# Patient Record
Sex: Male | Born: 1945 | Race: White | Hispanic: No | Marital: Married | Smoking: Never smoker
Health system: Southern US, Community
[De-identification: ages and names within clinical notes are randomized; demographics above are authoritative.]

## PROBLEM LIST (undated history)

## (undated) DIAGNOSIS — E782 Mixed hyperlipidemia: Secondary | ICD-10-CM

## (undated) DIAGNOSIS — G4730 Sleep apnea, unspecified: Secondary | ICD-10-CM

## (undated) DIAGNOSIS — I509 Heart failure, unspecified: Secondary | ICD-10-CM

## (undated) DIAGNOSIS — I82409 Acute embolism and thrombosis of unspecified deep veins of unspecified lower extremity: Secondary | ICD-10-CM

## (undated) DIAGNOSIS — IMO0002 Reserved for concepts with insufficient information to code with codable children: Secondary | ICD-10-CM

## (undated) DIAGNOSIS — I10 Essential (primary) hypertension: Secondary | ICD-10-CM

## (undated) DIAGNOSIS — I2510 Atherosclerotic heart disease of native coronary artery without angina pectoris: Secondary | ICD-10-CM

## (undated) DIAGNOSIS — E1165 Type 2 diabetes mellitus with hyperglycemia: Secondary | ICD-10-CM

## (undated) DIAGNOSIS — R911 Solitary pulmonary nodule: Secondary | ICD-10-CM

## (undated) DIAGNOSIS — E559 Vitamin D deficiency, unspecified: Secondary | ICD-10-CM

## (undated) DIAGNOSIS — E7800 Pure hypercholesterolemia, unspecified: Secondary | ICD-10-CM

## (undated) DIAGNOSIS — E039 Hypothyroidism, unspecified: Secondary | ICD-10-CM

## (undated) HISTORY — DX: Reserved for concepts with insufficient information to code with codable children: IMO0002

## (undated) HISTORY — DX: Solitary pulmonary nodule: R91.1

## (undated) HISTORY — DX: Heart failure, unspecified: I50.9

## (undated) HISTORY — DX: Pure hypercholesterolemia, unspecified: E78.00

## (undated) HISTORY — DX: Vitamin D deficiency, unspecified: E55.9

## (undated) HISTORY — DX: Acute embolism and thrombosis of unspecified deep veins of unspecified lower extremity: I82.409

## (undated) HISTORY — DX: Essential (primary) hypertension: I10

## (undated) HISTORY — DX: Sleep apnea, unspecified: G47.30

## (undated) HISTORY — DX: Atherosclerotic heart disease of native coronary artery without angina pectoris: I25.10

## (undated) HISTORY — DX: Type 2 diabetes mellitus with hyperglycemia: E11.65

## (undated) HISTORY — DX: Hypothyroidism, unspecified: E03.9

## (undated) HISTORY — DX: Mixed hyperlipidemia: E78.2

---

## 1998-02-09 ENCOUNTER — Encounter: Admission: RE | Admit: 1998-02-09 | Discharge: 1998-02-09 | Payer: Self-pay | Admitting: *Deleted

## 1999-03-30 ENCOUNTER — Encounter: Admission: RE | Admit: 1999-03-30 | Discharge: 1999-06-28 | Payer: Self-pay | Admitting: Family Medicine

## 2002-05-20 ENCOUNTER — Encounter: Payer: Self-pay | Admitting: Orthopedic Surgery

## 2002-05-26 ENCOUNTER — Inpatient Hospital Stay (HOSPITAL_COMMUNITY): Admission: RE | Admit: 2002-05-26 | Discharge: 2002-05-30 | Payer: Self-pay | Admitting: Orthopedic Surgery

## 2002-06-23 ENCOUNTER — Encounter: Admission: RE | Admit: 2002-06-23 | Discharge: 2002-08-21 | Payer: Self-pay | Admitting: Orthopedic Surgery

## 2005-08-16 ENCOUNTER — Inpatient Hospital Stay (HOSPITAL_COMMUNITY): Admission: RE | Admit: 2005-08-16 | Discharge: 2005-08-21 | Payer: Self-pay | Admitting: Orthopedic Surgery

## 2006-02-22 ENCOUNTER — Emergency Department (HOSPITAL_COMMUNITY): Admission: EM | Admit: 2006-02-22 | Discharge: 2006-02-23 | Payer: Self-pay | Admitting: Emergency Medicine

## 2007-05-08 ENCOUNTER — Encounter: Admission: RE | Admit: 2007-05-08 | Discharge: 2007-05-08 | Payer: Self-pay | Admitting: Emergency Medicine

## 2007-10-14 ENCOUNTER — Inpatient Hospital Stay (HOSPITAL_COMMUNITY): Admission: RE | Admit: 2007-10-14 | Discharge: 2007-10-17 | Payer: Self-pay | Admitting: Orthopedic Surgery

## 2008-12-28 IMAGING — CR DG HIP COMPLETE 2+V*R*
3 series · 3 of 3 positions shown · non-contrast
Comparison: No prior studies.

CLINICAL DATA: Osteoarthritis.  Preoperative study.

RIGHT HIP - COMPLETE 2+ VIEW

[t pelvis a.p.]
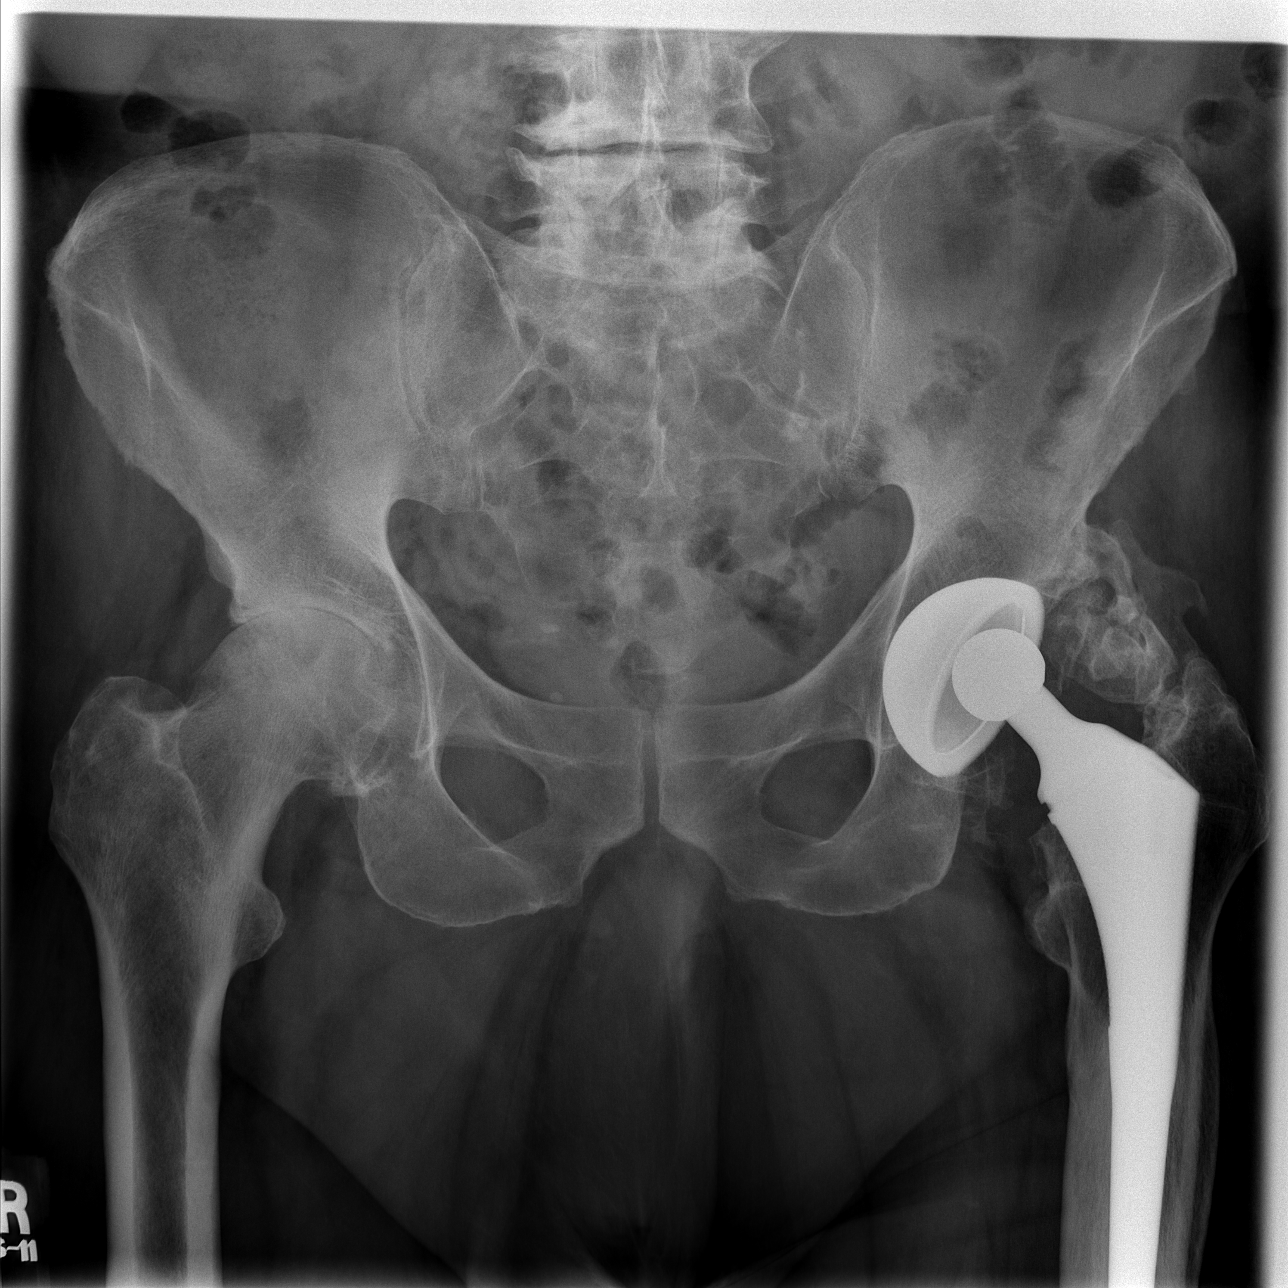

[t hip ap right]
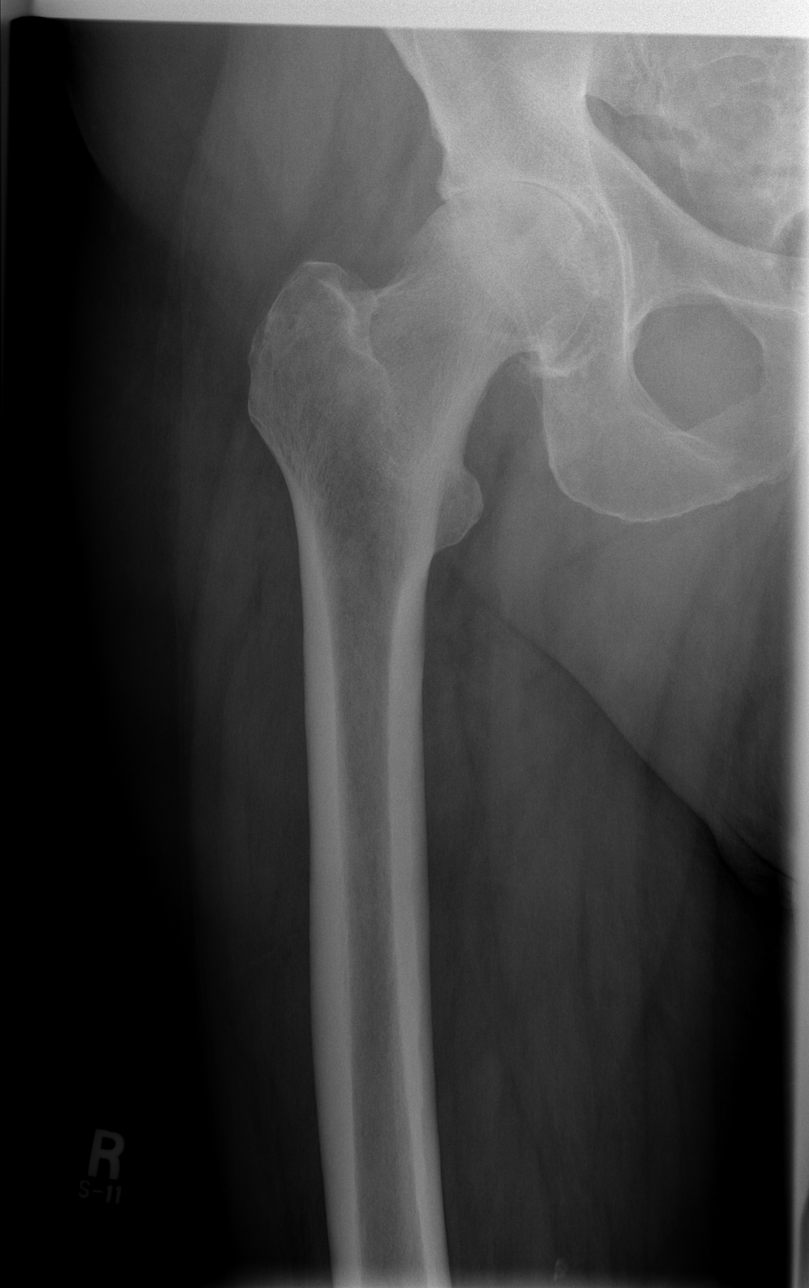

[t hip frog leg right]
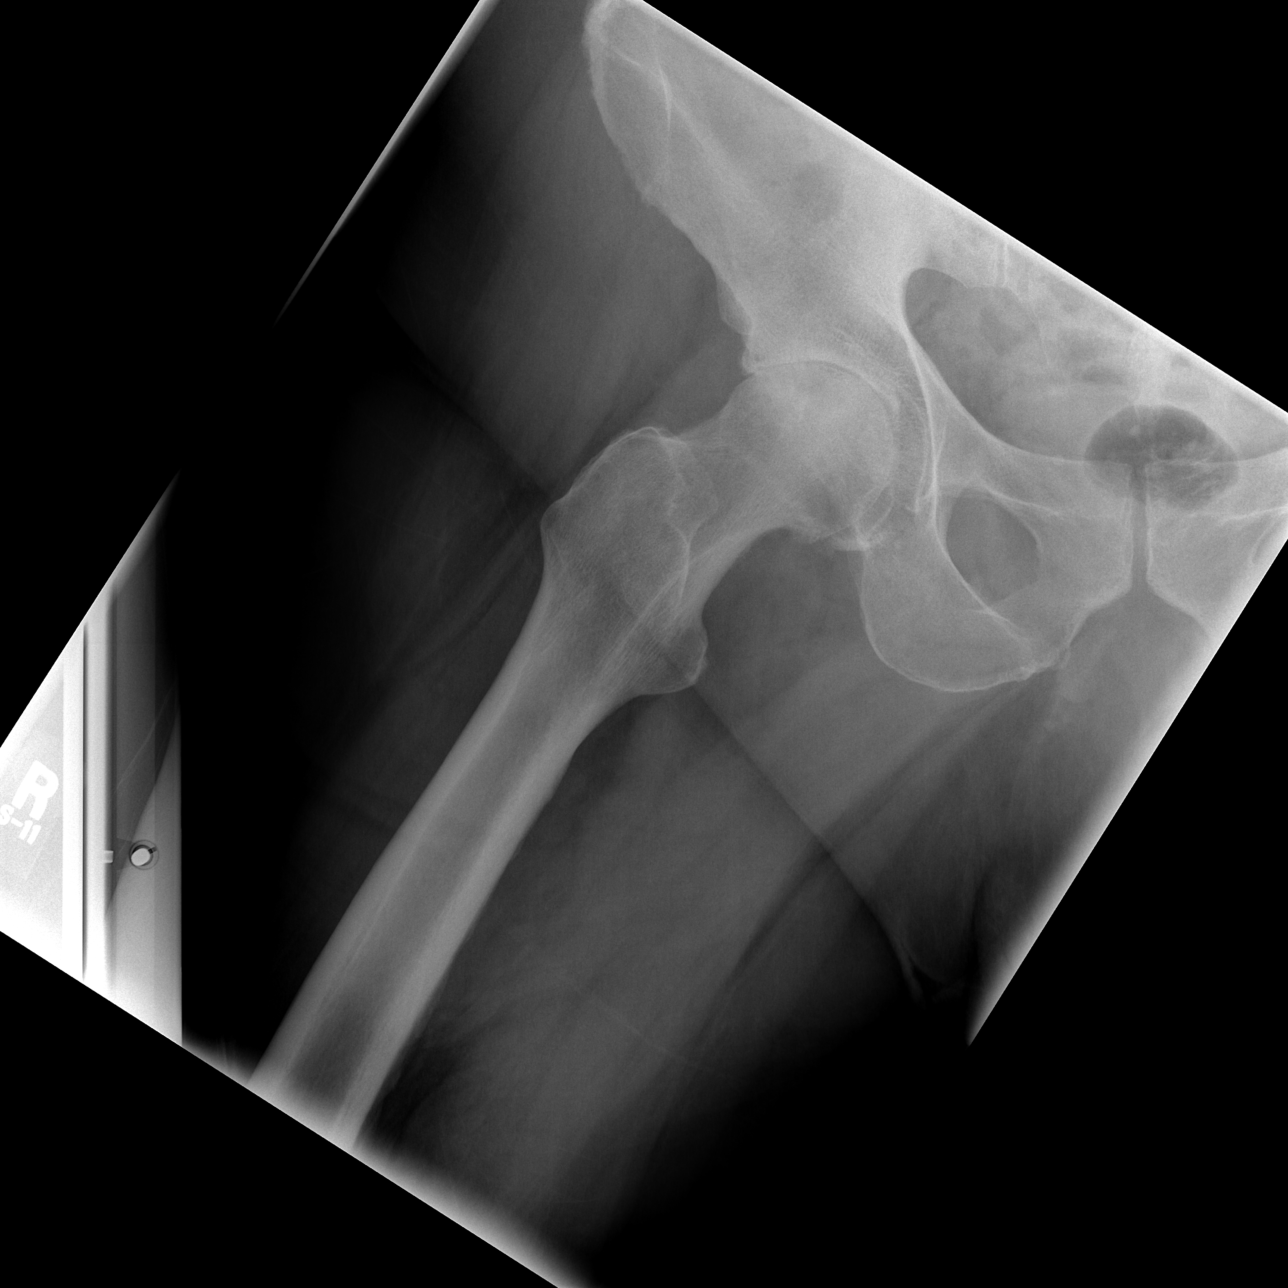

[3 of 3 positions shown; findings below may reference images not displayed]

FINDINGS: There are severe changes of osteoarthritis present with
marked loss of the normal joint space.  There has been a previous
left hip replacement.  Also degenerative disc space narrowing is
noted at the L4-5 level.
IMPRESSION: Severe changes of osteoarthritis involving the right hip with
marked joint space narrowing.

## 2010-11-01 NOTE — Op Note (Signed)
Harry Cabrera, Harry Cabrera              ACCOUNT NO.:  [id number]   MEDICAL RECORD NO.:  [id number]          PATIENT TYPE:  INP   LOCATION:  0007                         FACILITY:  [facility]   PHYSICIAN:  Gaynelle Arabian, M.D.    DATE OF BIRTH:  01/07/1946   DATE OF PROCEDURE:  10/14/2007  DATE OF DISCHARGE:                               OPERATIVE REPORT   PREOPERATIVE DIAGNOSIS:  Osteoarthritis, right hip.   POSTOPERATIVE DIAGNOSIS:  Osteoarthritis, right hip.   PROCEDURE:  Right total hip arthroplasty.   SURGEON:  Gaynelle Arabian, M.D.   ASSISTANT:  Alexzandrew L. Perkins, P.A.-C.   ANESTHESIA:  Spinal.   ESTIMATED BLOOD LOSS:  400.   DRAINS:  Hemovac x1.   COMPLICATIONS:  None.   CONDITION:  Stable to recovery.   BRIEF CLINICAL NOTE:  Harry Cabrera is a 65 year old male with severe end-  stage osteoarthritis of the right hip with progressively worsening pain  and dysfunction.  He has failed nonoperative management and he presents  now for a total hip arthroplasty.   PROCEDURE IN DETAIL:  After the successful administration of spinal  anesthetic, the patient is placed in the left lateral decubitus position  with the right side up and held with the hip positioner.  The right  lower extremity is isolated from his perineum with plastic drapes and  prepped and draped in the usual sterile fashion.  A short posterolateral  incision is made with a 10 blade through the subcutaneous tissue to the  level of the fascia lata, which is incised in line with the skin  incision.  The sciatic nerve is palpated and protected and the short  external rotators isolated off the femur.  Capsulectomy is performed and  the hip is dislocated.  Center of femoral head is marked and a trial  prosthesis placed such that the center of trial head corresponds to the  center of his native femoral head.  Osteotomy lines are marked on the  femoral neck and osteotomy made with an oscillating saw.  Femoral head  is  removed and the femur retracted anteriorly to gain acetabular  exposure.   Acetabular retractors are placed and the labrum and osteophytes are  removed.  Reaming starts at 47 mm, coursing in increments of 2 to 55 mm  and a 56-mm pinnacle acetabular shell is placed in anatomic position and  transfixed with two domed screws.  The permanent apex hole eliminator is  placed and then the permanent 40-mm neutral Ultramet needle metal liner  is placed for a metal-on-metal hip replacement.   The femur is prepared with the canal finder and irrigation.  Axial  reaming is performed up to 17.5 mm proximal reaming to a 22-F and the  sleeve machined to a large.  A 22-F large trial sleeve is placed with a  22 x 17 stem and a 36 +12 neck matching native anteversion.  The 40 +0  head is placed and then with the 40 + there was a little bit of neck  impingement and the leg felt shorter than the other side.  We went to a  40 +3, which had an excellent reduction and had outstanding stability  with full extension, full external rotation, 70 degrees flexion, 40  degrees of adduction and 90 degrees internal rotation in 90 degrees of  flexion and 70 degrees of internal rotation.  By placing the right leg  on top of the left, I felt as though the leg lengths were equal.  The  hip was then dislocated and the trials removed.  A permanent 22-F large  sleeve is placed with a 22 x 17 stem and a 36 +12 neck.  The 40 +3 head  is placed and the hip is reduced with the same stability parameters.  The wound is copiously irrigated with saline solution and the short  rotators reattached to the femur through drill holes.  The fascia lata  is closed over a Hemovac drain with interrupted #1 Vicryl, subcu closed  with #1 and then 2-0 Vicryl and subcuticular running 4-0 Monocryl.  The  incision is cleaned and dried and Steri-Strips and a bulky sterile  dressing applied.  The drain is hooked to suction and he is awakened and   transported to recovery in stable condition.      Gaynelle Arabian, M.D.  Electronically Signed     FA/MEDQ  D:  10/14/2007  T:  10/14/2007  Job:  [id number]

## 2010-11-01 NOTE — H&P (Signed)
Harry Cabrera, YALE              ACCOUNT NO.:  [id number]   MEDICAL RECORD NO.:  [id number]         PATIENT TYPE:  LINP   LOCATION:                               FACILITY:  [facility]   PHYSICIAN:  Gaynelle Arabian, M.D.    DATE OF BIRTH:  25-Feb-1946   DATE OF ADMISSION:  10/14/2007  DATE OF DISCHARGE:                              HISTORY & PHYSICAL   CHIEF COMPLAINT:  Right hip pain.   HISTORY OF PRESENT ILLNESS:  The patient is a 65 year old male well-  known to Dr. Gaynelle Arabian having previously undergone a left total knee  back in February of 2007.  He was doing well with his knee.  Unfortunately the right hip has been giving him a problem for quite some  time now.  It has been progressively getting worse.  It has felt like it  is giving away and popping out on him.  He was seen in the office and  shows significant end-stage arthritis.  He has elected to proceed with  surgery.  Risks and benefits were discussed.  The patient subsequently  admitted to the hospital.   ALLERGIES:  No known drug allergies.   CURRENT MEDICATIONS:  Metoprolol, simvastatin, fenofibrate, metformin,  lisinopril.   PAST MEDICAL HISTORY:  Sleep apnea for which he uses CPAP, hypertension,  hypercholesterolemia, coronary arterial disease, history of myocardial  infarction about 5 years ago, history of multiple renal calculi, history  of gout and newly diagnosed non-insulin-dependent diabetes.   PAST SURGICAL HISTORY:  Left total hip, left knee replacement, right  knee surgery.  Cardiac catheterization with two stents.   FAMILY HISTORY:  Father with a history of pneumonia.  Mother with  history of heart attack.   SOCIAL HISTORY:  Married 30 years, [employer].  Three children.  Half  glass of wine per day.  No tobacco.  Wife is currently on disability but  he is trying to make arrangements for son to help out in postop period.   REVIEW OF SYSTEMS:  GENERAL:  No fevers, chills, night sweats.  NEUROLOGICAL:   No seizures, syncope or paralysis.  RESPIRATORY: No  shortness of breath, productive cough or hemoptysis.  CARDIOVASCULAR:  History of MI and hypertension with coronary arterial disease.  Denies  any chest pain, angina or orthopnea.  GI:  No nausea, vomiting, diarrhea  or constipation.  GU:  A little bit of frequency.  No dysuria,  hematuria.  MUSCULOSKELETAL:  Right hip.   PHYSICAL EXAMINATION:  VITAL SIGNS:  Pulse 56, respirations 12, blood  pressure 144/88.  GENERAL:  A 65 year old white male well-nourished, well-developed, large  frame, overweight, no acute distress, alert and oriented and  cooperative.  HEENT:  Normocephalic, atraumatic.  Pupils are round and reactive.  Oropharynx is clear.  EOMs intact.  NECK:  Supple.  No bruits.  CHEST:  Clear anterior and posterior chest walls.  HEART:  Regular rate and rhythm.  No murmur.  ABDOMEN:  Soft, round and protuberant abdomen.  Bowel sounds present.  BREASTS:  Not pertinent to present illness.  RECTAL:  Not pertinent to present illness.  EXTREMITIES:  Right hip flexion 95, zero internal rotation, 20 degrees  external rotation, 20 degrees abduction.   IMPRESSION:  Osteoarthritis right hip.   PLAN:  The patient admitted to [facility] to undergo a right  total hip replacement arthroplasty.  Surgery will be performed by Dr.  Gaynelle Arabian and he has been seen preoperatively by his cardiologist,  Dr. Donnetta Hutching, and felt to be stable for surgery.  Recommend that he receive  his beta-blocker perioperatively.      Harry Cabrera, P.A.C.      Gaynelle Arabian, M.D.  Electronically Signed    ALP/MEDQ  D:  10/13/2007  T:  10/13/2007  Job:  [id number]   cc:   Harden Mo, M.D.  Fax: [phone]   Gaynelle Arabian, M.D.  Fax: [phone]

## 2010-11-04 NOTE — Op Note (Signed)
NAMEGENIE, SCHELER                        ACCOUNT NO.:  [id number]   MEDICAL RECORD NO.:  [id number]                   PATIENT TYPE:  INP   LOCATION:  X005                                 FACILITY:  [facility]   PHYSICIAN:  Gaynelle Arabian, M.D.                 DATE OF BIRTH:  04-27-1946   DATE OF PROCEDURE:  05/26/2002  DATE OF DISCHARGE:                                 OPERATIVE REPORT   PREOPERATIVE DIAGNOSIS:  Osteoarthritis, right knee.   POSTOPERATIVE DIAGNOSIS:  Osteoarthritis, right knee.   PROCEDURE:  Right total knee arthroplasty.   SURGEON:  Gaynelle Arabian, M.D.   ASSISTANT:  Metta Clines. Supple, M.D.   ANESTHESIA:  General. Postop epidural.   ESTIMATED BLOOD LOSS:  Minimal.   DRAINS:  Hemovac x1.   COMPLICATIONS:  None.   CONDITION:  Stable to recovery.   TOURNIQUET TIME:  54 minutes at 350 mmHg.   BRIEF CLINICAL NOTE:  Harry Cabrera is a 65 year old male with severe osteoarthritis  of the right knee end-stage with pain refractory to nonoperative management.  He presents now for right total knee arthroplasty.   DESCRIPTION OF PROCEDURE:  After successful administration of general  anesthetic, a tourniquet was placed high on the right thigh, right lower  extremity prepped and draped in the usual sterile fashion. The extremity was  wrapped in Esmarch, knee flexed, tourniquet inflated to 350 mmHg. A standard  midline incision was made with a 10 blade through the subcutaneous tissue to  the level of the extensor mechanism. A fresh blade is used to make a medial  parapatellar arthrotomy and then the soft tissue over the proximal medial  tibia subperiosteally elevated to the joint line with a knife and into the  semimembranosus bursa with a curved osteotome. _________ tissue over the  proximal lateral tibia was also elevated with attention being paid to  avoiding the patella tendon on the tibial tubercle. The patella was everted,  knee flexed 90 degrees and ACL and PCL  removed.   The drill was used to create a starting hole in the distal femur, canal was  irrigated and a 5 degree right valgus alignment placed. Referencing off the  posterior condyles, rotations marked, and a block pinned to remove 10 mm off  the distal femur. Distal resection was made with an oscillating saw and then  a sizing block is placed. A size 5 is most appropriate. Rotation corresponds  with the epicondylar axis. The anterior and posterior cuts were then made  for a size 5. The tibia was then subluxed forward and the menisci removed.  Extramedullary tibial alignment guide is placed referencing proximally at  the medial aspect of the tibial tubercle and distally along the second  metatarsal axis of the tibial crest. Blocks pinned to remove 10 mm off the  nondeficient lateral side.  Tibial resection was made with an oscillating  saw. Felt like there was  adequate bone cut thus prepared the proximal tibia  with the modular drill and keel punch with the size 5 mobile bearing tibial  tray. The intercondylar and chamfer blocks then placed on the distal femur  and those cuts subsequently made with an oscillating saw for the size 5. A  size 5 posterior stabilized femoral trial and size 5 mobile bearing tibial  trial and a 10 mm posterior stabilized rotating platform and insert trial  were placed. Full extension was achieved with excellent varus and valgus  balance throughout. The patella was then everted, thickness measured to be  26 mm, free hand resection taken down to 15 mm, 41 template placed, lug  holes drilled and trial patella placed. The patella tracked normally. All  trials removed except the femoral and then the osteophytes are removed off  the posterior femur. The femoral trial is then removed and all cut bone  surfaces prepared with pulsatile lavage. Cement is mixed and once ready for  implantation the size 5 mobile bearing tibial tray, size 5 posterior  stabilized femur and 41  patella are cemented into place. The patella is held  with the clamp. Trial 10 mm inserts placed, knee held in full extension and  all extruded cement is removed. Once the cement is fully hardened and the  permanent 10 mm posterior stabilized rotating platform insert is placed into  the tibial tray. The wound is then copiously irrigated with antibiotic  solution and extensor mechanism closed over a Hemovac drain with interrupted  #1 PDS. The tourniquet was released with a total time of 54 minutes. Flexion  against gravity was 135 degrees. The subcu was subsequently closed with  interrupted 2-0 Vicryl, subcuticular with running 4-0 Monocryl. The incision  was cleaned and dried and Steri-Strips and bulky sterile dressing applied.  The patient was awakened and transported to recovery in stable condition.                                               Gaynelle Arabian, M.D.    FA/MEDQ  D:  05/26/2002  T:  05/26/2002  Job:  [id number]

## 2010-11-04 NOTE — Op Note (Signed)
[name], SCHOOL              ACCOUNT NO.:  [id number]   MEDICAL RECORD NO.:  [id number]          PATIENT TYPE:  INP   LOCATION:  X009                         FACILITY:  [facility]   PHYSICIAN:  Gaynelle Arabian, M.D.    DATE OF BIRTH:  October 07, 1945   DATE OF PROCEDURE:  08/16/2005  DATE OF DISCHARGE:                                 OPERATIVE REPORT   PREOPERATIVE DIAGNOSIS:  Osteoarthritis of left knee.   POSTOPERATIVE DIAGNOSIS:  Osteoarthritis of left knee.   PROCEDURE:  Left total knee arthroplasty.   SURGEON:  Gaynelle Arabian, M.D.   ASSISTANT:  Arlee Muslim, P.A.   ANESTHESIA:  Spinal.   ESTIMATED BLOOD LOSS:  Minimal.   DRAIN:  Hemovac x1.   TOURNIQUET TIME:  Forty-two minutes at 300 mmHg.   COMPLICATIONS:  None.   CONDITION.:  Stable, to Recovery.   BRIEF CLINICAL NOTE:  Harry Cabrera is a 65 year old male with end-stage  osteoarthritis of the left knee with intractable pain.  He has had a very  successful right total knee arthroplasty and presents now for left total  knee arthroplasty.   PROCEDURE IN DETAIL:  After successful administration of a spinal  anesthetic, a tourniquet is placed high on the left thigh and left lower  extremity prepped and draped in the usual sterile fashion.  Extremity is  wrapped in an Esmarch, knee flexed and tourniquet inflated to 300 mmHg.  A  midline incision is made with a 10 blade through subcutaneous tissue to the  level of the extensor mechanism.  A fresh blade is used to make a medial  parapatellar arthrotomy.  Soft tissue over the proximal medial tibia is  subperiosteally elevated to the joint line with the knife and into the  semimembranosus bursa with a Cobb elevator.  Soft tissue over the proximal  lateral tibia is also elevated with attention being paid to avoiding the  patellar tendon on the tibial tubercle.  The patella is everted, knee flexed  to 90 degrees and ACL and PCL are removed.  A drill is used to create a  starting  hole in the distal femur and canal is irrigated.  A 5-degree left  valgus alignment guide is placed and referencing off the posterior condyles,  rotation is marked and the block pinned to remove 10 mm off the distal  femur.  Distal femoral resection is made with an oscillating saw.  Sizing  block is placed and size 5 is most appropriate.  Rotation is marked off the  epicondylar axis.  A size 5 cutting block is placed and the anterior,  posterior and chamfer cuts are made.   Tibia is subluxed forward and the menisci removed.  Extramedullary tibial  alignment guide is placed, referencing proximally at the medial aspect of  tibial tubercle and distally along the second metatarsal axis and tibial  crest.  Blocks are pinned to remove 10 mm off the non-deficient lateral  side.  We had to take an additional 2 to get down to the bottom of the  medial defect.  Tibial resection is made with an oscillating saw.  Size  5 is  the most appropriate tibial component and the proximal tibia is prepared  with a modular drill and keel punch for a size 5.  Femoral preparation is  completed with the intercondylar cut.   A size 5 mobile bearing tibial trial with a size 5 posterior-stabilized  femoral trial and a 12.5-mm posterior-stabilized rotating platform insert  trial were placed.  With the 12.5, full extension is achieved with excellent  varus and valgus balance throughout full range of motion.  The patella is  then everted and thickness measured to be 23 mm.  Freehand resection is  taken to 12 mm, a 41 template is placed, lug holes are drilled, trial  patella is placed and it tracks normally.  Osteophytes are then removed off  the posterior femur with the trial in place.  All trials are removed and the  cut bone surfaces are prepared with pulsatile lavage.  Cement is mixed and  once ready for implantation,. the size 5 mobile bearing tibial tray, a size  5 posterior-stabilized femur and 41 patella are  cemented into place and  patella is held with a clamp.  A trial 12.5-mm insert is placed and the knee  held in full extension and all extruded cement removed.  Once the cement has  fully hardened, then the permanent 12.5-mm posterior-stabilized rotating  platform insert is placed into the tibial tray.  Wound is copiously  irrigated with saline solution and the extensor mechanism closed over a  Hemovac drain with interrupted #1 PDS.  Flexion against gravity is 135  degrees.  Tourniquet is released with a total time of 42 minutes.  Subcu is  closed with interrupted 2-0 Vicryl and subcuticular running 4-0 Monocryl.  Incision is cleaned and dried and Steri-Strips and a bulky sterile dressing  applied.  The drain is hooked to suction and he is placed into a knee  immobilizer, awakened and transported to Recovery in stable condition.      Gaynelle Arabian, M.D.  Electronically Signed     FA/MEDQ  D:  08/16/2005  T:  08/17/2005  Job:  [id number]

## 2010-11-04 NOTE — Discharge Summary (Signed)
NAMESAHIR, CEPIN                        ACCOUNT NO.:  [id number]   MEDICAL RECORD NO.:  [id number]                   PATIENT TYPE:  INP   LOCATION:  [id number]                                 FACILITY:  [facility]   PHYSICIAN:  Gaynelle Arabian, M.D.                 DATE OF BIRTH:  07/13/1945   DATE OF ADMISSION:  05/26/2002  DATE OF DISCHARGE:  05/30/2002                                 DISCHARGE SUMMARY   ADMISSION DIAGNOSES:  1. Osteoarthritis right knee.  2. Angina.  3. Coronary arterial disease.  4. Hypercholesterolemia.  5. History of gastric ulcers.  6. History of gastrointestinal bleed.  7. Hypertension.  8. History of renal calculi.   DISCHARGE DIAGNOSES:  1. Osteoarthritis right knee status post right total knee arthroplasty.  2. Angina.  3. Coronary arterial disease.  4. Hypercholesterolemia.  5. History of gastric ulcers.  6. History of gastrointestinal bleed.  7. Hypertension.  8. History of renal calculi.  9. Postop hemorrhagic anemia.   PROCEDURE:  The patient was taken to the operating room on May 26, 2002  and underwent a right total knee arthroplasty.  Surgeon was Gaynelle Arabian,  M.D., assistant was Metta Clines. Supple, M.D.  Surgery was done under general  anesthesia with postop epidural and Hemovac drain x1 was placed at the time  of surgery.   CONSULTATIONS:  1. Physical therapy.  2. Occupational therapy.  3. Social Work case Mudlogger.  4. Anesthesia for management of epidural.   BRIEF HISTORY:  The patient is a 64 year old male seen by Dr. Wynelle Link for  ongoing right knee pain.  He has bilateral knee pain.  His right is more  symptomatic at this time and he has been seen in the past for progressive  pain starting to interfere with his daily activities and his functioning.  He has had two cardiac stents placed earlier this year and _________ of his  knee pain is interfering with his cardiac rehabilitation.  He has recently  stated that now his knees  are hurting him all of the time.  He was seen in  the office where x-rays revealed severe end-stage arthritis right more so  than the left.  It was felt that he had reached the point where he could  benefit from undergoing surgical intervention.  The risks and benefits were  discussed and the patient wishes to proceed.   LABORATORY AND ACCESSORY DATA:  CBC on admission showed a hemoglobin of  14.2, hematocrit 41.2, white blood cell count 8.9, red blood cell count  4.59.  Serial H&H's were followed throughout the hospital stay.  Hemoglobin  and hematocrit did decline to 9.1 and 25.5 on May 29, 2002 but was  stable at the time of discharge.  Differential on admission showed  eosinophils slightly high at 6.  Coagulation studies on admission all within  normal limits.  PT and INR were 15.3 and 1.2  on Coumadin therapy at the time  of discharge.  Routine chemistry on admission showed glucose slightly high  at 114 and BUN slightly high at 25.  Serial chemistries were taken  throughout the hospital stay and December 9th the glucose was high at 148.  On December 10th the sodium was low at 131 and potassium was low at 3.2 and  a glucose was high at 135.  Followup chemistries showed sodium still  slightly low at 134 and potassium returned to normal and was at 3.6 and  glucose was still high at 142.  Urinalysis on admission showed a moderate  amount of hemoglobin.  The patient's blood type was A positive with antibody  screen negative.   EKG on admission showed a normal EKG.  Preop chest x-ray revealed no active  disease.   HOSPITAL COURSE:  The patient was admitted to [facility] and taken  to the operating room.  He underwent the above-stated procedure without  complication.  The patient tolerated the procedure well and was allowed to  return to the recovery room and then the orthopedic floor in continued  postoperative care.  On postop day #1, the patient was complaining of some   pain as expected, no emergent complaints, was to have physical therapy and  occupational therapy.  Coumadin was to be begun at __________ on May 27, 2002.  Hemovac was discontinued on this day.  On May 28, 2002 the  patient was resting comfortably.  Epidural was still in place at the time of  visit.  Sodium and potassium were low at 131 and 3.2 respectively.  Lovenox  was started on this date after epidural discontinued until INR greater than  2.0.  He was also given 40 mEq of potassium q.2 h. x2.  The epidural was  later discontinued by anesthesia.  On May 29, 2002, postop day #3, seen  by orthopedics.  No bowel movement yet.  No major complaints though.  Hemoglobin and hematocrit stable at 9.1 and 25.5.  He is progressing with  therapy.  Plan to discharge home the following day.  May 30, 2002,  orthopedics on postop day #4, seen, the patient was doing well, ready for  discharge.  PT/INR was 15.3 and 1.2 on Coumadin therapy.  He is to be  discharged home on this day.   DISPOSITION:  The patient is discharged home on May 30, 2002.   DISCHARGE MEDICATIONS:  1. Coumadin per pharmacy protocol.  2. Robaxin 500 mg, dispense #50, one p.o. q.6-8 h. p.r.n. spasm.  3. Percocet 5/325 mg, dispense #50, one to two p.o. q.4-6 h. p.r.n. pain.   DIET:  As tolerated.   ACTIVITY:  Weightbearing as tolerated.  [facility] for home care.  Knee  precautions.   FOLLOW UP:  The patient is to follow up on December 26th with Dr. Wynelle Link.  He is to call the office for an appointment.   CONDITION ON DISCHARGE:  Stable, improved.     Pedro Earls, P.A.-C.                   Gaynelle Arabian, M.D.    SW/MEDQ  D:  05/30/2002  T:  05/30/2002  Job:  [id number]

## 2010-11-04 NOTE — H&P (Signed)
NAMETERALD, MILETO              ACCOUNT NO.:  [id number]   MEDICAL RECORD NO.:  [id number]           PATIENT TYPE:   LOCATION:                                 FACILITY:   PHYSICIAN:  Gaynelle Arabian, M.D.         DATE OF BIRTH:   DATE OF ADMISSION:  08/16/2005  DATE OF DISCHARGE:                                HISTORY & PHYSICAL   DATE OF OFFICE VISIT AND PHYSICAL:  August 08, 2005.   CHIEF COMPLAINT:  Left knee pain.   HISTORY OF PRESENT ILLNESS:  The patient is 65 year old male who has been  seen by Dr. Wynelle Link for ongoing left knee pain.  He has had a previous right  total knee done about three years ago and has been doing quite well from  that standpoint although his left knee has continued to bother him  significantly.  It feels like it wants to pop out of joint.  He was seen in  the office where x-rays showed significant end-stage arthritic changes in  the left knee.  The right knee prosthesis appears to be in excellent  position.  It is felt he has reached a point that he has would benefit  medically to undergo knee replacement.  Since he has done well with the  right, it is felt he would be an excellent candidate for the left.  The  risks and benefits were discussed. The patient is subsequently admitted to  the hospital.  He has had an exercise perfusion scan which states no  convincing evidence of ischemia at the achieved heart rate, normal left  ventricular systolic function and wall motion.  Ejection fraction was  calculated to be about 59%.   ALLERGIES:  No known drug allergies.   CURRENT MEDICATIONS:  1.  Toprol-XL 200 mg daily.  2.  Vytorin 10/40 daily.  3.  Hyzaar 100/25 daily.  4.  Enteric-coated aspirin 81 mg (stop prior to surgery).   PAST MEDICAL HISTORY:  1.  Hypertension.  2.  Hypercholesterolemia.  3.  Coronary arterial disease.  4.  History of multiple renal calculi.   PAST SURGICAL HISTORY:  1.  Hip replacement.  2.  Right total knee  replacement.  3.  He has undergone cardiac catheterization x2.  Both times he has had one      stent placed.   SOCIAL HISTORY:  Married.  Office work.  Non-smoker.  Occasional seldom  intake of alcohol.  Three children.   FAMILY HISTORY:  Significant for heart disease and stroke.   REVIEW OF SYSTEMS:  GENERAL:  No fever, chills or night sweats.  NEUROLOGIC:  No seizures, syncope or paralysis.  RESPIRATORY:  No shortness of breath,  productive cough, or hemoptysis.  CARDIOVASCULAR:  No chest pain, angina or  orthopnea.  GI:  No nausea, vomiting, diarrhea, or constipation.  GU:  He  had some slight frequency.  No dysuria, hematuria or discharge.  MUSCULOSKELETAL:  Left knee.   PHYSICAL EXAMINATION:  VITAL SIGNS:  Pulse 60, respirations 12, blood  pressure 130/78.  GENERAL:  A 65 year old white male,  well-nourished, well-developed,  overweight, large frame, in no acute distress.  Alert, oriented,  cooperative, pleasant.  HEENT:  Normocephalic, atraumatic.  Pupils are round and reactive.  Oropharynx clear.  EOMs are intact.  NECK:  Supple.  No carotid bruits.  CHEST:  Clear anterior and posterior chest wall.  No rhonchi, rales or  wheezing.  HEART:  Regular rate and rhythm.  No murmurs.  S1 and S2 noted.  ABDOMEN:  Soft, round, protuberant.  Bowel sounds present.  RECTAL/BREASTS/GENITALIA:  Not done and not pertinent to present illness.  EXTREMITIES:  Left knee.  Left knee shows a varus deformity, malalignment.  Range of motion 5 to 115 degrees.  Marked crepitus is noted.   IMPRESSION:  1.  Osteoarthritis left knee.  2.  Hypertension.  3.  Hypercholesterolemia.  4.  Coronary arterial disease.  5.  History of renal calculi.   PLAN:  The patient admitted to [facility] to undergo left total  knee arthroplasty.  Surgery will be performed by Dr. Gaynelle Arabian.      [name], P.A.      Gaynelle Arabian, M.D.  Electronically Signed    ALP/MEDQ  D:   08/15/2005  T:  08/16/2005  Job:  [id number]

## 2010-11-04 NOTE — Discharge Summary (Signed)
NAMEVIRAK, PONS              ACCOUNT NO.:  [id number]   MEDICAL RECORD NO.:  [id number]          PATIENT TYPE:  INP   LOCATION:  [place]                         FACILITY:  [facility]   PHYSICIAN:  Gaynelle Arabian, M.D.    DATE OF BIRTH:  1945/10/13   DATE OF ADMISSION:  08/16/2005  DATE OF DISCHARGE:  08/21/2005                                 DISCHARGE SUMMARY   ADMITTING DIAGNOSES:  1.  Osteoarthritis, left knee.  2.  Hypertension.  3.  Hypercholesterolemia.  4.  Coronary artery disease.  5.  Distal renal calculi.   DISCHARGE DIAGNOSES:  1.  Osteoarthritis, left knee, status post left total knee arthroplasty.  2.  Postoperative blood loss anemia, did not require transfusion.  3.  Hypertension.  4.  Hypercholesterolemia.  5.  Coronary artery disease.  6.  Distal renal calculi.   PROCEDURE:  August 16, 2005, procedure left total knee arthroplasty.  Surgeon: Dr. Wynelle Link.  Assistant: Arlee Muslim, PA-C.  Anesthesia:  Spinal.  Tourniquet time:  42 minutes.   CONSULTS:  None.   BRIEF HISTORY:  Harry Cabrera is a 65 year old male with end-stage arthritis of  the left knee with intractable pain.  Has had a very successful right total  knee, now presents now for total knee arthroplasty.   LABORATORY DATA:  Preop CBC:  Hemoglobin 13.2, hematocrit 39.2, white cell  count 7.3.  Postop hemoglobin 11.1 and 32.8, drifted down to 9.3, where the  H&H stabilized at 9.3 and 27.1.  PT/PTT preop 13.1 and 28, respectively.  Serial pro times followed.  Last noted PT/INR 18.1 and 1.5.  Chem panel on  admission all within normal limits with the exception of an elevated glucose  of 136.  Serial BMETs were followed.  Glucose went up to 187, back down to  169.  Had a slight increase from 1.5 to 1.7 but back down to 1.3.  Preop UA  small hemoglobin, 3-6 red cells otherwise negative.  Blood group type A  positive.   EKG dated August 14, 2005, P wave inverted in III, probable ectopic atrial  rhythm.  No  previous tracings to compare.  Confirmed by Dr. Genelle Bal.  Two view chest August 14, 2005, bibasilar atelectasis versus scarring.   HOSPITAL COURSE:  Admitted to [facility], tolerated the procedure  well, later transferred to the recovery room and then to the orthopedic  floor, started on PCA and p.o. analgesics for pain control following  surgery.  He actually was doing well on day 1, had a slight increase of the  BUN and creatinine from the preop level, gave him fluid bolus.  Had  excellent output.  Pressure was a little on the lower side of 100/61,  asymptomatic.  Healthy Hyzaar but continued the Toprol for heart rate.  Responded to fluids well.  Pressure came back up to 137/73.  It was well  rate controlled with a pulse of 89.  BUN and creatinine improved, down to 18  and 1.3.  Diuresing fluid well.  Started getting up with physical therapy,  out of bed in the chair on day  1 and then by day 2 ambulating 90 and 120  feet.  Continued to progress well and felt would be ready to go home.  However, over the weekend, unfortunately developed a gout flare-up and  started on gout medications.  Otherwise, the patient would have done well.  The gout held him up, and he was only ambulating short distances of about 6  feet but due to his pain, he was treated with his medications for the gout  which slowly improved through the weekend on March 3 and March 4.  However,  by Monday, March 5, the patient was doing better.  His gout flare had  improved.  He was seen in rounds by Dr. Wynelle Link.  He had been tolerating his  Colchicine and was discharged home.   DISCHARGE PLAN:  1.  The patient discharged home on August 21, 2005.  2.  Discharge diagnoses:  Please see above.  3.  Discharge medications:  Coumadin, Percocet, Robaxin, and Colchicine.  4.  Diet:  Cardiac diet.  5.  Follow up:  Two weeks from surgery.  6.  Activity:  Weightbearing as tolerated, home health PT and home health       nursing, total knee protocol, may start showering.   DISPOSITION:  Home.   CONDITION UPON DISCHARGE:  Improved.      [name], P.A.      Gaynelle Arabian, M.D.  Electronically Signed    ALP/MEDQ  D:  09/07/2005  T:  09/08/2005  Job:  [id number]   cc:   Dr. [facility]

## 2010-11-04 NOTE — Discharge Summary (Signed)
Harry Cabrera, Harry Cabrera              ACCOUNT NO.:  [id number]   MEDICAL RECORD NO.:  [id number]          PATIENT TYPE:  INP   LOCATION:  [id number]                         FACILITY:  [facility]   PHYSICIAN:  Gaynelle Arabian, M.D.    DATE OF BIRTH:  June 08, 1946   DATE OF ADMISSION:  10/14/2007  DATE OF DISCHARGE:  10/17/2007                               DISCHARGE SUMMARY   ADMITTING DIAGNOSIS:  1. Osteoarthritis, right hip.  2. Sleep apnea.  3. Hypertension.  4. Hypercholesterolemia.  5. Coronary arterial disease.  6. History of myocardial infarction about 5 years ago.  7. History of multiple renal calculi.  8. History of gout.  80. Newly diagnosed non-insulin-dependent diabetes mellitus.   DISCHARGE DIAGNOSIS:  1. Osteoarthritis right hip, status post right total replacement      arthroplasty.  2. Mild postoperative blood loss anemia, did not require transfusion.  3. Sleep apnea.  4. Hypertension.  5. Hypercholesterolemia.  6. Coronary arterial disease.  7. History of myocardial infarction about 5 years ago.  8. History of multiple renal calculi.  9. History of gout.  10.Newly diagnosed non-insulin-dependent diabetes mellitus.   PROCEDURE:  October 14, 2007, right total hip.   SURGEON:  Gaynelle Arabian, M.D.   ASSISTANT:  Alexzandrew L. Perkins, P.A.-C.   ANESTHESIA:  Spinal anesthesia.   CONSULTATIONS:  None.   BRIEF HISTORY:  Mr. Stauff is a 65 year old male with severe end-stage  arthritis of the right hip, progressive, worsening pain and dysfunction  to nonoperative management, and now presents for a total hip  arthroplasty.   LABORATORY DATA:  Preop CBC showed a hemoglobin of 13.7, hematocrit of  41.0, white cell count 4.9, platelets 227.  Postop hemoglobin of 10 with  drift down to 9.8 last night.  H and H are 9.5 and 27.1.  PT/PTT preop  13.4 and 27 respectively.  INR 1.0.  Serial protimes followed.  Last  PT/INR was 17.2 and 1.4.  Chem panel on admission slowed slightly  elevated BUN and creatinine of 24 and 1.8 preoperatively.  The remaining  Chem panel within normal limits.  Serial BMETs were followed.  BUN and  creatinine came down to normal levels of 15 and 1.2 respectively.  Electrolytes remained within normal limits.  Preop UA showed a moderate  hemoglobin, 0-2 white cells, 0-2 red cells, rare epithelials, otherwise  negative, also with few bacteria.  Blood group type A+.  Hip films October 08, 2007, severe changes of osteoarthritis, right hip with marked joint  space narrowing.  Two-view chest, October 08, 2007, thoracic scoliosis.  No evidence of active chest disease.  Portable hip and pelvis films,  satisfactory appearance of right hip replacement.  EKG dated, it looks  like September 20, 2007, normal sinus rhythm, normal EKG.   HOSPITAL COURSE:  The patient was admitted to [facility],  tolerated the procedure well.  Later transferred from the recovery room  to the orthopedic floor, started on PCA and p.o. analgesic pain control  following surgery.  Doing well on the morning of day 1 after surgery.  We discontinued the PCA later  that day.  Started weightbearing, partial  weight, 25%  to 50% to the right leg.  Hip precautions, therapy for  mobility.  Coumadin for DVT prophylaxis, 24 hours postop IV antibiotics.  Had a little bit of low pressure with systolic about the 0000000, so we gave  him fluids.  Blood pressure medications were placed on parameters and  held for the lower pressure.  Sublingual nitro was added since he had a  history of MI 5 years ago, and coronary arterial disease.  He was placed  on sliding scale for his diabetes since he was on metformin, and it was  held for pharmacy protocol following surgery.  He was doing well on day  1.  By day 2 he was doing excellent, got up and walked 100 feet.  Dressing was changed and incision looked good.  Hemoglobin was a little  bit lower at 9.8.  He was asymptomatic with this.  His blood  pressure  had come back up and he was in the 130s now.  His pain medications,  which caused a little bit of itching, so we reduced him down to Vicodin,  which improved.  Continued to progress well with physical therapy, and  he was ready go home by the following day on postop day #3, October 17, 2007.   PLAN:  1. The patient is discharged home on October 17, 2007.  2. Discharge diagnoses:  Please see above.  3. Discharge meds:  Vicodin, Robaxin, Nu-Iron, and Coumadin.  He was      given 1 dose of Lovenox prior to discharge.  4. Followup in 2 weeks.   ACTIVITY:  Partial weightbearing 25% to 50% , right lower extremity.  Home health PT, home nursing total protocol, hip precautions.   DIET:  Diabetic, cardiac diet.   DISPOSITION:  Home.   CONDITION ON DISCHARGE:  Improved.      Alexzandrew L. Perkins, P.A.C.      Gaynelle Arabian, M.D.  Electronically Signed    ALP/MEDQ  D:  11/20/2007  T:  11/20/2007  Job:  [id number]   cc:   Gaynelle Arabian, M.D.  Fax: [place] Dara Lords, P.A.C.

## 2010-11-04 NOTE — H&P (Signed)
[name], Harry Cabrera                        ACCOUNT NO.:  [id number]   MEDICAL RECORD NO.:  [id number]                   PATIENT TYPE:  INP   LOCATION:  NA                                   FACILITY:  [facility]   PHYSICIAN:  Gaynelle Arabian, M.D.                 DATE OF BIRTH:  1945/07/03   DATE OF ADMISSION:  05/26/2002  DATE OF DISCHARGE:                                HISTORY & PHYSICAL   CHIEF COMPLAINT:  Right knee pain.   HISTORY OF PRESENT ILLNESS:  The patient is a 65 year old male who was seen  by Dr. Wynelle Link for ongoing right knee pain.  He has bilateral knee pain, and  the right knee is more symptomatic at this time.  He has been seen in the  past for progressive knee pain.  It has started to interfere with his daily  activities and his function.  He has had to have two cardiac stents placed  earlier this year and would like to exercise, but his knee pain is  interfering with his cardiac rehabilitation.  He has reached a stage now  where his knees are hurting him all the time.  He was seen in the office and  x-rays revealed severe end-stage arthritis, right moreso than the left.  It  is felt he has reached a point where he could benefit from undergoing  surgical intervention.  The risks and benefits were discussed, and the  patient is subsequently admitted to the hospital.   ALLERGIES:  No known drug allergies.   CURRENT MEDICATIONS:  1. Zocor 40 mg daily.  2. He was on Plavix; however, this medication is stopped.  3. Bextra 20 mg daily, stopped.  4. Ranitidine 150 mg daily.  5. Imdur 30 mg at night.  6. Toprol XL 200 mg daily.   PAST MEDICAL HISTORY:  1. Angina.  2. Coronary arterial disease.  3. Hypercholesterolemia.  4. History of gastric ulcer.  5. History of GI bleed.  6. Hypertension.  7. History of renal calculi.   PAST SURGICAL HISTORY:  1. Cardiac catheterization with a stent placed in 2002.  2. Cardiac catheterization with a stent placed in July  2003.  3. Left total knee replacement per Dr. Larose Kells.   SOCIAL HISTORY:  Married.  Three children.  Nonsmoker.  An occasional cigar.  Occasional intake of wine.   FAMILY HISTORY:  Mother deceased, age 15, with MI.  Father deceased, age 70,  with pneumonia, stroke, and irregular heart rate.   REVIEW OF SYSTEMS:  GENERAL:  No has had some occasional hot flashes which  he attributes to medications.  No fevers or chills.  NEUROLOGIC:  No  seizures, syncope, or paralysis.  RESPIRATORY:  No shortness of breath,  productive cough, or hemoptysis.  CARDIOVASCULAR:  He does have a history of  angina.  No chest pain recent.  No palpitations.  No orthopnea.  GASTROINTESTINAL:  He has had a history of ulcers and a GI bleed.  No  nausea, vomiting, diarrhea, or constipation.  No blood or mucous in the  stool recently.  GENITOURINARY:  No dysuria, hematuria, or discharge.  MUSCULOSKELETAL:  Pertinent to the right knee found in the history of  present illness.   PHYSICAL EXAMINATION:  VITAL SIGNS:  Pulse 60, respirations 12, blood  pressure 142/98.  GENERAL:  The patient is a 65 year old white male.  Well-nourished, well-  developed.  Appears to be in no acute distress.  He is alert and oriented  and cooperative.  HEENT:  Normocephalic, atraumatic.  Pupils round and reactive.  EOMs are  intact.  NECK:  Supple.  CHEST:  Clear to auscultation anterior and posterior chest walls.  HEART:  Regular rate and rhythm.  No murmurs.  S1, S2 noted.  ABDOMEN:  Soft and nontender.  Bowel sounds are present.  RECTAL, BREASTS, GENITALIA:  Not done.  Not pertinent to present illness.  EXTREMITIES:  Right lower extremity, right knee reveals range of motion of 0-  100 degrees.  He has a varus deformity malalignment noted.  Marked crepitus.  No instability.  Motor function is intact.   IMPRESSION:  1. Osteoarthritis right knee.  2. Angina.  3. Coronary arterial disease.  4. Hypercholesterolemia.  5. History of  gastric ulcers.  6. History of gastrointestinal bleed.  7. Hypertension.  8. History of renal calculi.   PLAN:  The patient will be admitted to [facility] to undergo right  total knee replacement arthroplasty.  The surgery will be performed by Dr.  Gaynelle Arabian.  The patient has been seen by Dr. Zorita Pang at  [facility] and has been cleared for the procedure.     [name], P.A.              Gaynelle Arabian, M.D.    ALP/MEDQ  D:  05/25/2002  T:  05/25/2002  Job:  [id number]   cc:   Zorita Pang, M.D.  [facility]  [place], [place]

## 2011-03-14 LAB — CBC
HCT: 28.4 — ABNORMAL LOW
Hemoglobin: 10 — ABNORMAL LOW
Hemoglobin: 9.5 — ABNORMAL LOW
MCHC: 33.5
MCHC: 35
MCV: 87.1
MCV: 89.4
RBC: 3.12 — ABNORMAL LOW
RBC: 3.17 — ABNORMAL LOW
RBC: 3.3 — ABNORMAL LOW
RBC: 4.68
WBC: 4.9
WBC: 5
WBC: 6.9
WBC: 8.5

## 2011-03-14 LAB — URINALYSIS, ROUTINE W REFLEX MICROSCOPIC
Bilirubin Urine: NEGATIVE
Glucose, UA: NEGATIVE
Nitrite: NEGATIVE
Specific Gravity, Urine: 1.019
pH: 5.5

## 2011-03-14 LAB — URINE MICROSCOPIC-ADD ON

## 2011-03-14 LAB — PROTIME-INR
INR: 1.2
INR: 1.4
Prothrombin Time: 17.1 — ABNORMAL HIGH

## 2011-03-14 LAB — COMPREHENSIVE METABOLIC PANEL
ALT: 34
AST: 34
CO2: 26
Calcium: 9.9
GFR calc Af Amer: 45 — ABNORMAL LOW
GFR calc non Af Amer: 37 — ABNORMAL LOW
Sodium: 140

## 2011-03-14 LAB — BASIC METABOLIC PANEL
Calcium: 8.6
Chloride: 105
GFR calc Af Amer: >60
GFR calc Af Amer: >60
GFR calc non Af Amer: 50 — ABNORMAL LOW
Potassium: 4.2
Sodium: 137

## 2011-03-14 LAB — TYPE AND SCREEN
ABO/RH(D): A POS
Antibody Screen: NEGATIVE

## 2021-01-05 ENCOUNTER — Encounter: Payer: Self-pay | Admitting: Cardiovascular Disease

## 2021-01-07 ENCOUNTER — Ambulatory Visit (INDEPENDENT_AMBULATORY_CARE_PROVIDER_SITE_OTHER): Payer: Medicare Other | Admitting: Cardiovascular Disease

## 2021-01-07 ENCOUNTER — Encounter: Payer: Self-pay | Admitting: Cardiovascular Disease

## 2021-01-07 ENCOUNTER — Other Ambulatory Visit: Payer: Self-pay

## 2021-01-07 DIAGNOSIS — I10 Essential (primary) hypertension: Secondary | ICD-10-CM

## 2021-01-07 DIAGNOSIS — I2510 Atherosclerotic heart disease of native coronary artery without angina pectoris: Secondary | ICD-10-CM

## 2021-01-07 DIAGNOSIS — I519 Heart disease, unspecified: Secondary | ICD-10-CM

## 2021-01-07 DIAGNOSIS — E782 Mixed hyperlipidemia: Secondary | ICD-10-CM

## 2021-01-07 DIAGNOSIS — E785 Hyperlipidemia, unspecified: Secondary | ICD-10-CM | POA: Insufficient documentation

## 2021-01-07 NOTE — H&P (View-Only) (Signed)
01/07/2021 Harry Cabrera   02-Mar-1946  [id number]  Primary Physician Guadlupe Spanish, MD Primary Cardiologist: Lorretta Harp MD [name], [place]  HPI:  Harry Cabrera is a 75 y.o. thin appearing married Caucasian male father of 62, grandfather of 8 new grandson referred by Dr. Claudie Leach for right left heart cath to define his anatomy and physiology in the setting of newly recognized LV dysfunction.  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  His risk factors include treated hypertension, diabetes and hyperlipidemia.  His mother did die of heart attack at age 50.  His other problems include obstructive sleep apnea on CPAP, history of bilateral total knee and hip replacements and CKD4.  He apparently had a stent back in 2003 and ultimately underwent CABG [facility]3 at [facility] 2019.  At that time his EF was normal however recently because of dyspnea with exertion he underwent evaluation by Dr. Claudie Leach as an outpatient revealing an EF of 20 to 25% by 2D echo and a Myoview that showed no ischemia or scar but LV dysfunction for unclear reasons.   Current Meds  Medication Sig   aspirin 81 MG EC tablet Take by mouth.   calcium-vitamin D (OSCAL WITH D) 500-200 MG-UNIT tablet Take 1 tablet by mouth daily. 1000IU   carvedilol (COREG) 12.5 MG tablet Take 12.5 mg by mouth 2 (two) times daily.   Cholecalciferol 25 MCG (1000 UT) capsule Take 1 capsule by mouth daily.   donepezil (ARICEPT ODT) 10 MG disintegrating tablet Take 10 mg by mouth daily.   ENTRESTO 24-26 MG Take 1 tablet by mouth 2 (two) times daily.   furosemide (LASIX) 20 MG tablet Take 20 mg by mouth daily.   metFORMIN (GLUCOPHAGE) 500 MG tablet Take 500 mg by mouth 2 (two) times daily.   mirtazapine (REMERON) 15 MG tablet Take 15 mg by mouth at bedtime as needed.   sertraline (ZOLOFT) 50 MG tablet Take 50 mg by mouth daily.   simvastatin (ZOCOR) 80 MG tablet Take 80 mg by mouth daily.   [DISCONTINUED] XARELTO 20  MG TABS tablet Take 20 mg by mouth daily.     No Known Allergies  Social History   Socioeconomic History   Marital status: Married    Spouse name: Not on file   Number of children: Not on file   Years of education: Not on file   Highest education level: Not on file  Occupational History   Not on file  Tobacco Use   Smoking status: Never   Smokeless tobacco: Never  Substance and Sexual Activity   Alcohol use: Yes   Drug use: Not on file   Sexual activity: Not on file  Other Topics Concern   Not on file  Social History Narrative   Not on file   Social Determinants of Health   Financial Resource Strain: Not on file  Food Insecurity: Not on file  Transportation Needs: Not on file  Physical Activity: Not on file  Stress: Not on file  Social Connections: Not on file  Intimate Partner Violence: Not on file     Review of Systems: General: negative for chills, fever, night sweats or weight changes.  Cardiovascular: negative for chest pain, dyspnea on exertion, edema, orthopnea, palpitations, paroxysmal nocturnal dyspnea or shortness of breath Dermatological: negative for rash Respiratory: negative for cough or wheezing Urologic: negative for hematuria Abdominal: negative for nausea, vomiting, diarrhea, bright red blood per rectum, melena, or hematemesis Neurologic:  negative for visual changes, syncope, or dizziness All other systems reviewed and are otherwise negative except as noted above.    Blood pressure 140/88, pulse 77, height '6\' 1"'$  (1.854 m), weight 213 lb 12.8 oz (97 kg), SpO2 95 %.  General appearance: alert and no distress Neck: no adenopathy, no carotid bruit, no JVD, supple, symmetrical, trachea midline, and thyroid not enlarged, symmetric, no tenderness/mass/nodules Lungs: clear to auscultation bilaterally Heart: regular rate and rhythm, S1, S2 normal, no murmur, click, rub or gallop Extremities: extremities normal, atraumatic, no cyanosis or edema Pulses:  2+ and symmetric Skin: Skin color, texture, turgor normal. No rashes or lesions Neurologic: Grossly normal  EKG sinus rhythm at 77 with left bundle branch block.  I personally reviewed this EKG.  ASSESSMENT AND PLAN:   Coronary artery disease History of CAD status post stenting back in 2003 and CABG x3 at [facility] in 2019.  Apparently his EF prior to bypass was normal and currently his EF is in the 20 to 25% range for unclear reasons.  He was referred by Dr. Claudie Leach for right left heart cath to define his anatomy and physiology.  Hyperlipidemia History of hyperlipidemia on statin therapy followed by his PCP.  Essential hypertension History of essential hypertension with blood pressure measured today at 140/88.  He is on carvedilol.  Left ventricular dysfunction History of reduced LV function by echo in the 20 to 25% range with Myoview that showed no evidence of ischemia or scar.  Apparently his EF was normal at the time of bypass surgery 3 years ago.  He has noticed some increasing dyspnea on exertion.  His EKG shows left bundle branch block.  He was begun on carvedilol and Entresto by Dr. Claudie Leach.  He is referred for right and left heart cath to define his anatomy and physiology.     Lorretta Harp MD FACP,FACC,FAHA, [facility] 01/07/2021 11:26 AM

## 2021-01-07 NOTE — Assessment & Plan Note (Signed)
History of hyperlipidemia on statin therapy followed by his PCP 

## 2021-01-07 NOTE — Assessment & Plan Note (Signed)
History of essential hypertension with blood pressure measured today at 140/88.  He is on carvedilol.

## 2021-01-07 NOTE — Patient Instructions (Signed)
Medication Instructions:  No Changes In Medications at this time.  *If you need a refill on your cardiac medications before your next appointment, please call your pharmacy*  Lab Work: [facility]  If you have labs (blood work) drawn today and your tests are completely normal, you will receive your results only by: [place] (if you have MyChart) OR A paper copy in the mail If you have any lab test that is abnormal or we need to change your treatment, we will call you to review the results.  Testing/Procedures: Your physician has requested that you have a cardiac catheterization. Cardiac catheterization is used to diagnose and/or treat various heart conditions. Doctors may recommend this procedure for a number of different reasons. The most common reason is to evaluate chest pain. Chest pain can be a symptom of coronary artery disease (CAD), and cardiac catheterization can show whether plaque is narrowing or blocking your heart's arteries. This procedure is also used to evaluate the valves, as well as measure the blood flow and oxygen levels in different parts of your heart. For further information please visit [email or web address]. Please follow instruction sheet, as given.  Follow-Up: At [facility], you and your health needs are our priority.  As part of our continuing mission to provide you with exceptional heart care, we have created designated Provider Care Teams.  These Care Teams include your primary Cardiologist (physician) and Advanced Practice Providers (APPs -  Physician Assistants and Nurse Practitioners) who all work together to provide you with the care you need, when you need it.  We recommend signing up for the patient portal called "MyChart".  Sign up information is provided on this After Visit Summary.  MyChart is used to connect with patients for Virtual Visits (Telemedicine).  Patients are able to view lab/test results, encounter notes, upcoming appointments, etc.   Non-urgent messages can be sent to your provider as well.   To learn more about what you can do with MyChart, go to [email or web address].    Your next appointment:   2 week(s) POST PROCEDURE   The format for your next appointment:   In Person  Provider:   Quay Burow, MD  Other Instructions  [place] [address] Dept: [phone] Loc: [phone]  Harry Cabrera  01/07/2021  You are scheduled for a Cardiac Catheterization on Thursday, July 28 with Dr. Quay Burow.  1. Please arrive at the [facility] (Main Entrance A) at [facility]: [address] [place], [place] [address] at 9:30 AM (This time is two hours before your procedure to ensure your preparation). Free valet parking service is available.   Special note: Every effort is made to have your procedure done on time. Please understand that emergencies sometimes delay scheduled procedures.  2. Diet: Do not eat solid foods after midnight.  The patient may have clear liquids until 5am upon the day of the procedure.  3. Labs: You will need to have blood drawn on Friday, July 22 at [place]  Open: [place] (Lunch 12:30 - 1:30)   Phone: [phone]. You do not need to be fasting.  4. Medication instructions in preparation for your procedure:   Contrast Allergy: No  Do not take Diabetes Med Glucophage (Metformin) on the day of the procedure and HOLD 48 HOURS AFTER THE PROCEDURE.  On the morning of your procedure, take your Aspirin  and any morning medicines NOT listed above.  You may use sips of water.  5. Plan for one night stay--bring personal belongings. 6. Bring a current list of your medications and current insurance cards. 7. You MUST have a responsible person to drive you home. 8. Someone MUST be with you the first 24 hours after you arrive home or  your discharge will be delayed. 9. Please wear clothes that are easy to get on and off and wear slip-on shoes.  Thank you for allowing [place] to care for you!   -- [name]

## 2021-01-07 NOTE — Assessment & Plan Note (Signed)
History of reduced LV function by echo in the 20 to 25% range with Myoview that showed no evidence of ischemia or scar.  Apparently his EF was normal at the time of bypass surgery 3 years ago.  He has noticed some increasing dyspnea on exertion.  His EKG shows left bundle branch block.  He was begun on carvedilol and Entresto by Dr. Claudie Leach.  He is referred for right and left heart cath to define his anatomy and physiology.

## 2021-01-07 NOTE — Assessment & Plan Note (Signed)
History of CAD status post stenting back in 2003 and CABG x3 at [facility] in 2019.  Apparently his EF prior to bypass was normal and currently his EF is in the 20 to 25% range for unclear reasons.  He was referred by Dr. Claudie Leach for right left heart cath to define his anatomy and physiology.

## 2021-01-07 NOTE — Progress Notes (Signed)
01/07/2021 Harry Cabrera   Dec 08, 1945  [id number]  Primary Physician Guadlupe Spanish, MD Primary Cardiologist: Lorretta Harp MD [name], [place]  HPI:  Harry Cabrera is a 75 y.o. thin appearing married Caucasian male father of 60, grandfather of 60 new grandson referred by Dr. Claudie Leach for right left heart cath to define his anatomy and physiology in the setting of newly recognized LV dysfunction.  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  His risk factors include treated hypertension, diabetes and hyperlipidemia.  His mother did die of heart attack at age 46.  His other problems include obstructive sleep apnea on CPAP, history of bilateral total knee and hip replacements and CKD4.  He apparently had a stent back in 2003 and ultimately underwent CABG x3 at [facility] 2019.  At that time his EF was normal however recently because of dyspnea with exertion he underwent evaluation by Dr. Claudie Leach as an outpatient revealing an EF of 20 to 25% by 2D echo and a Myoview that showed no ischemia or scar but LV dysfunction for unclear reasons.   Current Meds  Medication Sig   aspirin 81 MG EC tablet Take by mouth.   calcium-vitamin D (OSCAL WITH D) 500-200 MG-UNIT tablet Take 1 tablet by mouth daily. 1000IU   carvedilol (COREG) 12.5 MG tablet Take 12.5 mg by mouth 2 (two) times daily.   Cholecalciferol 25 MCG (1000 UT) capsule Take 1 capsule by mouth daily.   donepezil (ARICEPT ODT) 10 MG disintegrating tablet Take 10 mg by mouth daily.   ENTRESTO 24-26 MG Take 1 tablet by mouth 2 (two) times daily.   furosemide (LASIX) 20 MG tablet Take 20 mg by mouth daily.   metFORMIN (GLUCOPHAGE) 500 MG tablet Take 500 mg by mouth 2 (two) times daily.   mirtazapine (REMERON) 15 MG tablet Take 15 mg by mouth at bedtime as needed.   sertraline (ZOLOFT) 50 MG tablet Take 50 mg by mouth daily.   simvastatin (ZOCOR) 80 MG tablet Take 80 mg by mouth daily.   [DISCONTINUED] XARELTO 20  MG TABS tablet Take 20 mg by mouth daily.     No Known Allergies  Social History   Socioeconomic History   Marital status: Married    Spouse name: Not on file   Number of children: Not on file   Years of education: Not on file   Highest education level: Not on file  Occupational History   Not on file  Tobacco Use   Smoking status: Never   Smokeless tobacco: Never  Substance and Sexual Activity   Alcohol use: Yes   Drug use: Not on file   Sexual activity: Not on file  Other Topics Concern   Not on file  Social History Narrative   Not on file   Social Determinants of Health   Financial Resource Strain: Not on file  Food Insecurity: Not on file  Transportation Needs: Not on file  Physical Activity: Not on file  Stress: Not on file  Social Connections: Not on file  Intimate Partner Violence: Not on file     Review of Systems: General: negative for chills, fever, night sweats or weight changes.  Cardiovascular: negative for chest pain, dyspnea on exertion, edema, orthopnea, palpitations, paroxysmal nocturnal dyspnea or shortness of breath Dermatological: negative for rash Respiratory: negative for cough or wheezing Urologic: negative for hematuria Abdominal: negative for nausea, vomiting, diarrhea, bright red blood per rectum, melena, or hematemesis Neurologic:  negative for visual changes, syncope, or dizziness All other systems reviewed and are otherwise negative except as noted above.    Blood pressure 140/88, pulse 77, height '6\' 1"'$  (1.854 m), weight 213 lb 12.8 oz (97 kg), SpO2 95 %.  General appearance: alert and no distress Neck: no adenopathy, no carotid bruit, no JVD, supple, symmetrical, trachea midline, and thyroid not enlarged, symmetric, no tenderness/mass/nodules Lungs: clear to auscultation bilaterally Heart: regular rate and rhythm, S1, S2 normal, no murmur, click, rub or gallop Extremities: extremities normal, atraumatic, no cyanosis or edema Pulses:  2+ and symmetric Skin: Skin color, texture, turgor normal. No rashes or lesions Neurologic: Grossly normal  EKG sinus rhythm at 77 with left bundle branch block.  I personally reviewed this EKG.  ASSESSMENT AND PLAN:   Coronary artery disease History of CAD status post stenting back in 2003 and CABG x3 at [facility] in 2019.  Apparently his EF prior to bypass was normal and currently his EF is in the 20 to 25% range for unclear reasons.  He was referred by Dr. Claudie Leach for right left heart cath to define his anatomy and physiology.  Hyperlipidemia History of hyperlipidemia on statin therapy followed by his PCP.  Essential hypertension History of essential hypertension with blood pressure measured today at 140/88.  He is on carvedilol.  Left ventricular dysfunction History of reduced LV function by echo in the 20 to 25% range with Myoview that showed no evidence of ischemia or scar.  Apparently his EF was normal at the time of bypass surgery 3 years ago.  He has noticed some increasing dyspnea on exertion.  His EKG shows left bundle branch block.  He was begun on carvedilol and Entresto by Dr. Claudie Leach.  He is referred for right and left heart cath to define his anatomy and physiology.     Lorretta Harp MD FACP,FACC,FAHA, [facility] 01/07/2021 11:26 AM

## 2021-01-08 LAB — BASIC METABOLIC PANEL
BUN/Creatinine Ratio: 25 — ABNORMAL HIGH (ref 10–24)
BUN: 36 mg/dL — ABNORMAL HIGH (ref 8–27)
CO2: 18 mmol/L — ABNORMAL LOW (ref 20–29)
Calcium: 8.8 mg/dL (ref 8.6–10.2)
Chloride: 104 mmol/L (ref 96–106)
Creatinine, Ser: 1.44 mg/dL — ABNORMAL HIGH (ref 0.76–1.27)
Glucose: 135 mg/dL — ABNORMAL HIGH (ref 65–99)
Sodium: 144 mmol/L (ref 134–144)
eGFR: 51 mL/min/{1.73_m2} — ABNORMAL LOW (ref >59)

## 2021-01-08 LAB — CBC
Hematocrit: 40.8 % (ref 37.5–51.0)
Hemoglobin: 12.7 g/dL — ABNORMAL LOW (ref 13.0–17.7)
MCH: 28 pg (ref 26.6–33.0)
MCHC: 31.1 g/dL — ABNORMAL LOW (ref 31.5–35.7)
MCV: 90 fL (ref 79–97)
Platelets: 219 10*3/uL (ref 150–450)
RBC: 4.53 x10E6/uL (ref 4.14–5.80)
RDW: 13.6 % (ref 11.6–15.4)
WBC: 6.1 10*3/uL (ref 3.4–10.8)

## 2021-01-11 ENCOUNTER — Telehealth: Payer: Self-pay | Admitting: *Deleted

## 2021-01-11 NOTE — Telephone Encounter (Signed)
01/07/21 BMP

## 2021-01-11 NOTE — Telephone Encounter (Signed)
01/07/21 BMP specimen hemolyzed-potassium not done. Per Dr Sherron Monday on arrival to Short Stay 01/13/21, patient is aware.

## 2021-01-11 NOTE — Telephone Encounter (Signed)
Pt contacted pre-catheterization scheduled at [facility] for: Thursday January 13, 2021 11:30 AM Verified arrival time and place: [place] [facility]) at: 6:30 AM-pre-procedure hydration   No solid food after midnight prior to cath, clear liquids until 5 AM day of procedure.  Hold: Lasix-none 01/11/21 until post procedure-* Entresto-PM prior/AM of procedure-GFR 51 Metformin-day of procedure and 48 hours post procedure  Except hold medications AM meds can be  taken pre-cath with sips of water including: aspirin 81 mg   Confirmed patient has responsible adult to drive home post procedure and be with patient first 24 hours after arriving home: yes  You are allowed ONE visitor in the waiting room during the time you are at the hospital for your procedure. Both you and your visitor must wear a mask once you enter the hospital.   Patient reports does not currently have any symptoms concerning for COVID-19 and no household members with COVID-19 like illness.     Reviewed procedure/mask/visitor instructions with patient, discussed pre-procedure hydration.     *01/07/21 BMP-per Dr Gwenlyn Found: -SCr mildly increased 1.44. Hold furosemide 3 days prior to R/L heart cath andbring in early for hydration 75 cc/hr for 4 hours

## 2021-01-13 ENCOUNTER — Ambulatory Visit (HOSPITAL_COMMUNITY)
Admission: RE | Admit: 2021-01-13 | Discharge: 2021-01-13 | Disposition: A | Discharge status: Home / Self Care | Payer: Medicare Other | Attending: Cardiovascular Disease | Admitting: Cardiovascular Disease

## 2021-01-13 ENCOUNTER — Other Ambulatory Visit: Payer: Self-pay

## 2021-01-13 ENCOUNTER — Encounter (HOSPITAL_COMMUNITY)
Admission: RE | Disposition: A | Discharge status: Home / Self Care | Payer: Self-pay | Source: Home / Self Care | Attending: Cardiovascular Disease

## 2021-01-13 DIAGNOSIS — N184 Chronic kidney disease, stage 4 (severe): Secondary | ICD-10-CM | POA: Insufficient documentation

## 2021-01-13 DIAGNOSIS — E785 Hyperlipidemia, unspecified: Secondary | ICD-10-CM | POA: Diagnosis not present

## 2021-01-13 DIAGNOSIS — Z79899 Other long term (current) drug therapy: Secondary | ICD-10-CM | POA: Insufficient documentation

## 2021-01-13 DIAGNOSIS — Z955 Presence of coronary angioplasty implant and graft: Secondary | ICD-10-CM | POA: Diagnosis not present

## 2021-01-13 DIAGNOSIS — Z8249 Family history of ischemic heart disease and other diseases of the circulatory system: Secondary | ICD-10-CM | POA: Insufficient documentation

## 2021-01-13 DIAGNOSIS — I519 Heart disease, unspecified: Secondary | ICD-10-CM | POA: Diagnosis not present

## 2021-01-13 DIAGNOSIS — I130 Hypertensive heart and chronic kidney disease with heart failure and stage 1 through stage 4 chronic kidney disease, or unspecified chronic kidney disease: Secondary | ICD-10-CM | POA: Diagnosis not present

## 2021-01-13 DIAGNOSIS — Z7984 Long term (current) use of oral hypoglycemic drugs: Secondary | ICD-10-CM | POA: Insufficient documentation

## 2021-01-13 DIAGNOSIS — I2510 Atherosclerotic heart disease of native coronary artery without angina pectoris: Secondary | ICD-10-CM | POA: Diagnosis not present

## 2021-01-13 DIAGNOSIS — E1122 Type 2 diabetes mellitus with diabetic chronic kidney disease: Secondary | ICD-10-CM | POA: Insufficient documentation

## 2021-01-13 DIAGNOSIS — I509 Heart failure, unspecified: Secondary | ICD-10-CM | POA: Insufficient documentation

## 2021-01-13 DIAGNOSIS — Z951 Presence of aortocoronary bypass graft: Secondary | ICD-10-CM | POA: Diagnosis not present

## 2021-01-13 DIAGNOSIS — I420 Dilated cardiomyopathy: Secondary | ICD-10-CM | POA: Diagnosis not present

## 2021-01-13 DIAGNOSIS — Z7982 Long term (current) use of aspirin: Secondary | ICD-10-CM | POA: Diagnosis not present

## 2021-01-13 HISTORY — PX: RIGHT/LEFT HEART CATH AND CORONARY/GRAFT ANGIOGRAPHY: CATH118267

## 2021-01-13 LAB — POCT I-STAT 7, (LYTES, BLD GAS, ICA,H+H)
Acid-base deficit: 1 mmol/L (ref 0.0–2.0)
Bicarbonate: 25.1 mmol/L (ref 20.0–28.0)
Calcium, Ion: 1.19 mmol/L (ref 1.15–1.40)
HCT: 34 % — ABNORMAL LOW (ref 39.0–52.0)
Hemoglobin: 11.6 g/dL — ABNORMAL LOW (ref 13.0–17.0)
O2 Saturation: 99 %
Potassium: 3.5 mmol/L (ref 3.5–5.1)
Sodium: 143 mmol/L (ref 135–145)
TCO2: 26 mmol/L (ref 22–32)
pCO2 arterial: 45.3 mmHg (ref 32.0–48.0)
pH, Arterial: 7.351 (ref 7.350–7.450)
pO2, Arterial: 166 mmHg — ABNORMAL HIGH (ref 83.0–108.0)

## 2021-01-13 LAB — POCT I-STAT EG7
Acid-Base Excess: 0 mmol/L (ref 0.0–2.0)
Acid-Base Excess: 0 mmol/L (ref 0.0–2.0)
Bicarbonate: 26.2 mmol/L (ref 20.0–28.0)
Bicarbonate: 26.3 mmol/L (ref 20.0–28.0)
Calcium, Ion: 1.25 mmol/L (ref 1.15–1.40)
Calcium, Ion: 1.25 mmol/L (ref 1.15–1.40)
HCT: 35 % — ABNORMAL LOW (ref 39.0–52.0)
HCT: 35 % — ABNORMAL LOW (ref 39.0–52.0)
Hemoglobin: 11.9 g/dL — ABNORMAL LOW (ref 13.0–17.0)
Hemoglobin: 11.9 g/dL — ABNORMAL LOW (ref 13.0–17.0)
O2 Saturation: 74 %
O2 Saturation: 75 %
Potassium: 3.6 mmol/L (ref 3.5–5.1)
Potassium: 3.6 mmol/L (ref 3.5–5.1)
Sodium: 143 mmol/L (ref 135–145)
Sodium: 144 mmol/L (ref 135–145)
TCO2: 28 mmol/L (ref 22–32)
TCO2: 28 mmol/L (ref 22–32)
pCO2, Ven: 50 mmHg (ref 44.0–60.0)
pCO2, Ven: 50.2 mmHg (ref 44.0–60.0)
pH, Ven: 7.327 (ref 7.250–7.430)
pH, Ven: 7.329 (ref 7.250–7.430)
pO2, Ven: 43 mmHg (ref 32.0–45.0)
pO2, Ven: 43 mmHg (ref 32.0–45.0)

## 2021-01-13 LAB — GLUCOSE, CAPILLARY
Glucose-Capillary: 151 mg/dL — ABNORMAL HIGH (ref 70–99)
Glucose-Capillary: 109 mg/dL — ABNORMAL HIGH (ref 70–99)

## 2021-01-13 LAB — BASIC METABOLIC PANEL
Anion gap: 8 (ref 5–15)
BUN: 33 mg/dL — ABNORMAL HIGH (ref 8–23)
CO2: 25 mmol/L (ref 22–32)
Calcium: 8.7 mg/dL — ABNORMAL LOW (ref 8.9–10.3)
Chloride: 105 mmol/L (ref 98–111)
Creatinine, Ser: 1.7 mg/dL — ABNORMAL HIGH (ref 0.61–1.24)
GFR, Estimated: 42 mL/min — ABNORMAL LOW (ref >60)
Glucose, Bld: 145 mg/dL — ABNORMAL HIGH (ref 70–99)
Potassium: 3.5 mmol/L (ref 3.5–5.1)
Sodium: 138 mmol/L (ref 135–145)

## 2021-01-13 SURGERY — RIGHT/LEFT HEART CATH AND CORONARY/GRAFT ANGIOGRAPHY
Anesthesia: LOCAL

## 2021-01-13 MED ORDER — ACETAMINOPHEN 325 MG PO TABS: 650.0000 mg | ORAL_TABLET | ORAL | Status: DC | PRN

## 2021-01-13 MED ORDER — SACUBITRIL-VALSARTAN 24-26 MG PO TABS
1.0000 | ORAL_TABLET | Freq: Two times a day (BID) | ORAL | Status: DC
  Filled 2021-01-13: qty 1

## 2021-01-13 MED ORDER — HYDRALAZINE HCL 20 MG/ML IJ SOLN: 10.0000 mg | INTRAMUSCULAR | Status: DC | PRN

## 2021-01-13 MED ORDER — ONDANSETRON HCL 4 MG/2ML IJ SOLN: 4.0000 mg | Freq: Four times a day (QID) | INTRAMUSCULAR | Status: DC | PRN

## 2021-01-13 MED ORDER — SODIUM CHLORIDE 0.9% FLUSH: 3.0000 mL | Freq: Two times a day (BID) | INTRAVENOUS | Status: DC

## 2021-01-13 MED ORDER — IOHEXOL 350 MG/ML SOLN
INTRAVENOUS | Status: DC | PRN
  Administered 2021-01-13: 70 mL

## 2021-01-13 MED ORDER — MIDAZOLAM HCL 2 MG/2ML IJ SOLN
INTRAMUSCULAR | Status: DC | PRN
Start: 2021-01-13 — End: 2021-01-13
  Administered 2021-01-13: 1 mg via INTRAVENOUS

## 2021-01-13 MED ORDER — LABETALOL HCL 5 MG/ML IV SOLN: 10.0000 mg | INTRAVENOUS | Status: DC | PRN

## 2021-01-13 MED ORDER — DONEPEZIL HCL 10 MG PO TBDP: 10.0000 mg | ORAL_TABLET | Freq: Every day | ORAL | Status: DC

## 2021-01-13 MED ORDER — SODIUM CHLORIDE 0.9 % IV SOLN: INTRAVENOUS | Status: DC

## 2021-01-13 MED ORDER — FENTANYL CITRATE (PF) 100 MCG/2ML IJ SOLN
INTRAMUSCULAR | Status: DC | PRN
  Administered 2021-01-13: 25 ug via INTRAVENOUS

## 2021-01-13 MED ORDER — MIRTAZAPINE 15 MG PO TABS
15.0000 mg | ORAL_TABLET | Freq: Every day | ORAL | Status: DC
  Filled 2021-01-13: qty 1

## 2021-01-13 MED ORDER — HEPARIN (PORCINE) IN NACL 1000-0.9 UT/500ML-% IV SOLN
INTRAVENOUS | Status: DC | PRN
  Administered 2021-01-13 (×2): 500 mL

## 2021-01-13 MED ORDER — SODIUM CHLORIDE 0.9 % IV SOLN: 250.0000 mL | INTRAVENOUS | Status: DC | PRN

## 2021-01-13 MED ORDER — ASPIRIN 81 MG PO CHEW: 81.0000 mg | CHEWABLE_TABLET | Freq: Every day | ORAL | Status: DC

## 2021-01-13 MED ORDER — MIDAZOLAM HCL 2 MG/2ML IJ SOLN
INTRAMUSCULAR | Status: DC | PRN
  Administered 2021-01-13: 1 mg via INTRAVENOUS

## 2021-01-13 MED ORDER — ASPIRIN 81 MG PO TBEC: 81.0000 mg | DELAYED_RELEASE_TABLET | Freq: Every day | ORAL | Status: DC

## 2021-01-13 MED ORDER — LIDOCAINE HCL (PF) 1 % IJ SOLN
INTRAMUSCULAR | Status: DC | PRN
  Administered 2021-01-13: 15 mL

## 2021-01-13 MED ORDER — SODIUM CHLORIDE 0.9% FLUSH: 3.0000 mL | INTRAVENOUS | Status: DC | PRN

## 2021-01-13 MED ORDER — CARVEDILOL 12.5 MG PO TABS
12.5000 mg | ORAL_TABLET | Freq: Two times a day (BID) | ORAL | Status: DC
  Filled 2021-01-13: qty 1

## 2021-01-13 MED ORDER — MIDAZOLAM HCL 2 MG/2ML IJ SOLN
INTRAMUSCULAR | Status: AC
  Filled 2021-01-13: qty 2

## 2021-01-13 MED ORDER — ASPIRIN 81 MG PO CHEW: 81.0000 mg | CHEWABLE_TABLET | ORAL | Status: DC

## 2021-01-13 MED ORDER — VITAMIN D 25 MCG (1000 UNIT) PO TABS
1000.0000 [IU] | ORAL_TABLET | Freq: Every day | ORAL | Status: DC
  Filled 2021-01-13: qty 1

## 2021-01-13 MED ORDER — MORPHINE SULFATE (PF) 2 MG/ML IV SOLN
2.0000 mg | INTRAVENOUS | Status: DC | PRN
Start: 2021-01-13 — End: 2021-01-13

## 2021-01-13 MED ORDER — SERTRALINE HCL 50 MG PO TABS
50.0000 mg | ORAL_TABLET | Freq: Every day | ORAL | Status: DC
  Filled 2021-01-13: qty 1

## 2021-01-13 MED ORDER — HEPARIN (PORCINE) IN NACL 1000-0.9 UT/500ML-% IV SOLN
INTRAVENOUS | Status: AC
  Filled 2021-01-13: qty 1000

## 2021-01-13 MED ORDER — FENTANYL CITRATE (PF) 100 MCG/2ML IJ SOLN
INTRAMUSCULAR | Status: AC
  Filled 2021-01-13: qty 2

## 2021-01-13 MED ORDER — METFORMIN HCL 500 MG PO TABS
500.0000 mg | ORAL_TABLET | Freq: Two times a day (BID) | ORAL | Status: DC
  Filled 2021-01-13: qty 1

## 2021-01-13 MED ORDER — FUROSEMIDE 20 MG PO TABS
20.0000 mg | ORAL_TABLET | Freq: Every day | ORAL | Status: DC
  Filled 2021-01-13: qty 1

## 2021-01-13 MED ORDER — LISINOPRIL 10 MG PO TABS
10.0000 mg | ORAL_TABLET | Freq: Every day | ORAL | Status: DC
  Filled 2021-01-13: qty 1

## 2021-01-13 MED ORDER — LIDOCAINE HCL (PF) 1 % IJ SOLN
INTRAMUSCULAR | Status: AC
  Filled 2021-01-13: qty 30

## 2021-01-13 SURGICAL SUPPLY — 12 items
CATH INFINITI 5 FR LCB (CATHETERS) ×2 IMPLANT
CATH INFINITI 5FR AL1 (CATHETERS) ×2 IMPLANT
CATH INFINITI 5FR MULTPACK ANG (CATHETERS) ×2 IMPLANT
CATH SWAN GANZ 7F STRAIGHT (CATHETERS) ×2 IMPLANT
KIT HEART LEFT (KITS) ×2 IMPLANT
PACK CARDIAC CATHETERIZATION (CUSTOM PROCEDURE TRAY) ×2 IMPLANT
SHEATH PINNACLE 5F 10CM (SHEATH) ×2 IMPLANT
SHEATH PINNACLE 7F 10CM (SHEATH) ×4 IMPLANT
SHEATH PROBE COVER 6X72 (BAG) ×2 IMPLANT
TRANSDUCER W/STOPCOCK (MISCELLANEOUS) ×2 IMPLANT
TUBING CIL FLEX 10 FLL-RA (TUBING) ×2 IMPLANT
WIRE EMERALD 3MM-J .035X150CM (WIRE) ×2 IMPLANT

## 2021-01-13 NOTE — Interval H&P Note (Signed)
Cath Lab Visit (complete for each Cath Lab visit)  Clinical Evaluation Leading to the Procedure:   ACS: No.  Non-ACS:    Anginal Classification: CCS I  Anti-ischemic medical therapy: Minimal Therapy (1 class of medications)  Non-Invasive Test Results: Low-risk stress test findings: cardiac mortality <1%/year  Prior CABG: Previous CABG      History and Physical Interval Note:  01/13/2021 10:20 AM  Harry Cabrera  has presented today for surgery, with the diagnosis of cardiomyopathy.  The various methods of treatment have been discussed with the patient and family. After consideration of risks, benefits and other options for treatment, the patient has consented to  Procedure(s): RIGHT/LEFT HEART CATH AND CORONARY/GRAFT ANGIOGRAPHY (N/A) as a surgical intervention.  The patient's history has been reviewed, patient examined, no change in status, stable for surgery.  I have reviewed the patient's chart and labs.  Questions were answered to the patient's satisfaction.     Quay Burow

## 2021-01-13 NOTE — Progress Notes (Signed)
Site area: Rt groin Sheath size: 5Fr arterial, 7Fr venous Site prior to removal: Level 1 Pressure applied for: 20 min.  Manual?: Yes Patient status during removal: Alert Post removal site status: Level 1 Post instructions given? Yes  Post removal pulses: Bilateral DP Dressing type: Gauze and Tegaderm Bedrest begins @: 12:00  Comments:

## 2021-01-14 ENCOUNTER — Encounter (HOSPITAL_COMMUNITY): Payer: Self-pay | Admitting: Cardiovascular Disease

## 2021-01-17 ENCOUNTER — Institutional Professional Consult (permissible substitution): Payer: Medicare Other | Admitting: Internal Medicine

## 2021-01-18 ENCOUNTER — Other Ambulatory Visit: Payer: Self-pay

## 2021-01-18 ENCOUNTER — Other Ambulatory Visit: Payer: Self-pay | Admitting: *Deleted

## 2021-01-18 MED ORDER — ENTRESTO 24-26 MG PO TABS
1.0000 | ORAL_TABLET | Freq: Two times a day (BID) | ORAL | 2 refills | Status: DC
Start: 2021-01-18 — End: 2021-03-21

## 2021-01-20 ENCOUNTER — Encounter (HOSPITAL_COMMUNITY): Payer: Medicare Other | Admitting: Cardiology

## 2021-02-08 ENCOUNTER — Ambulatory Visit: Payer: Medicare Other | Admitting: Cardiovascular Disease

## 2021-02-09 ENCOUNTER — Telehealth: Payer: Self-pay | Admitting: Cardiovascular Disease

## 2021-02-09 NOTE — Telephone Encounter (Signed)
Spoke with patient to clear everything up for him. Patient stated when he left the hospital he did not like the idea of having multiple cardiologist caring for him. I explained to patient that there are cardiologist that specialize in certain areas Patient stated he has an appointment with Dr.Rosario coming up soon, and he will discuss with him them what he would like to do. Patient wanted to clarify that we contact him in the future if an appointment needs to be rescheduled or cancelled.

## 2021-02-09 NOTE — Telephone Encounter (Signed)
Patient came into office on 8/23 for original appointment scheduled with Dr.Berry for a f/u. Patient was not aware this appointment had been cancelled. I told patient I would look into in chart and discuss with nurse to see exactly why appointment was cancelled, and what was going on. VM was left for patient to call back and ask for Katelyn C. On 8/24.

## 2021-03-21 ENCOUNTER — Other Ambulatory Visit: Payer: Self-pay | Admitting: Cardiovascular Disease
